# Patient Record
Sex: Female | Born: 1950 | Race: White | Hispanic: No | Marital: Married | Smoking: Never smoker
Health system: Southern US, Community
[De-identification: ages and names within clinical notes are randomized; demographics above are authoritative.]

## PROBLEM LIST (undated history)

## (undated) DIAGNOSIS — J449 Chronic obstructive pulmonary disease, unspecified: Secondary | ICD-10-CM

## (undated) DIAGNOSIS — J45909 Unspecified asthma, uncomplicated: Secondary | ICD-10-CM

## (undated) DIAGNOSIS — K5909 Other constipation: Secondary | ICD-10-CM

## (undated) DIAGNOSIS — N200 Calculus of kidney: Secondary | ICD-10-CM

## (undated) HISTORY — DX: Other constipation: K59.09

## (undated) HISTORY — DX: Unspecified asthma, uncomplicated: J45.909

## (undated) HISTORY — PX: OTHER SURGICAL HISTORY: SHX169

## (undated) HISTORY — PX: COLONOSCOPY: SHX174

## (undated) HISTORY — DX: Chronic obstructive pulmonary disease, unspecified: J44.9

## (undated) HISTORY — DX: Calculus of kidney: N20.0

---

## 2007-04-18 ENCOUNTER — Ambulatory Visit (HOSPITAL_BASED_OUTPATIENT_CLINIC_OR_DEPARTMENT_OTHER): Admission: RE | Admit: 2007-04-18 | Discharge: 2007-04-18 | Payer: Self-pay | Admitting: Orthopaedic Surgery

## 2007-08-03 ENCOUNTER — Inpatient Hospital Stay (HOSPITAL_COMMUNITY): Admission: RE | Admit: 2007-08-03 | Discharge: 2007-08-05 | Payer: Self-pay | Admitting: Orthopaedic Surgery

## 2007-08-24 IMAGING — RF DG FLUORO GUIDE SPINAL/SI JT INJ*R*
1 series · 2 of 2 positions shown · non-contrast
Comparison: none

04/19/07 - DUPLICATE COPY for exam association in [facility] ? No change from original report.
CLINICAL DATA: Cervical radiculopathy.  
SPOT FLUOROSCOPIC VIEWS DURING CERVICAL SPINE INJECTION:
No radiologist was involved in the case or present.

[Series 1: run · 2 of 2 slices shown]
[im 1/2]
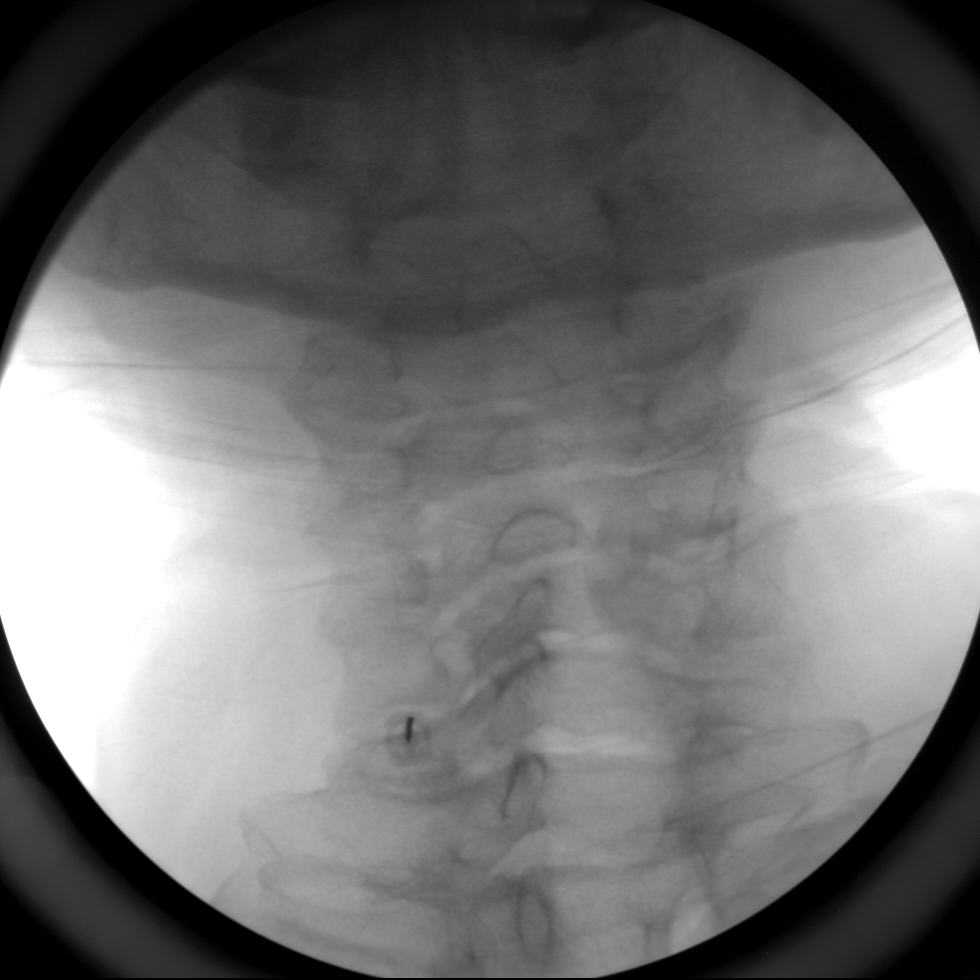
[im 2/2]
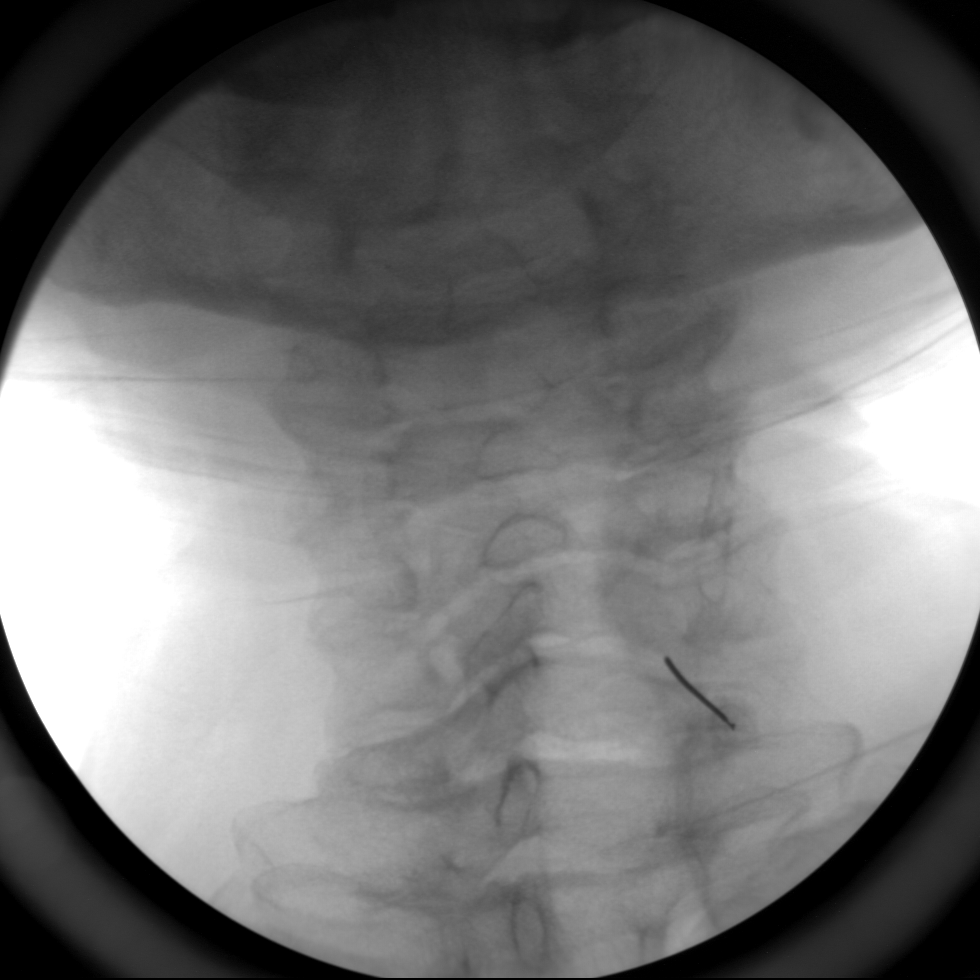

[2 of 2 positions shown; findings below may reference images not displayed]

FINDINGS: Two spot fluoroscopic views are centered over the lower cervical spine.  The C-2 level and the T1- level are not included in the imaging therefore the needle localization is difficult to assess on these two views but is localized to the lower cervical spine.
IMPRESSION: Fluoroscopic localization of the lower cervical spine.

## 2007-12-10 IMAGING — CR DG CERVICAL SPINE 2 OR 3 VIEWS
2 series · 2 of 2 positions shown · non-contrast
Comparison: none

CLINICAL DATA: Postop.  
CERVICAL SPINE ? 2-3 VIEW:

[w c-spine lat *]
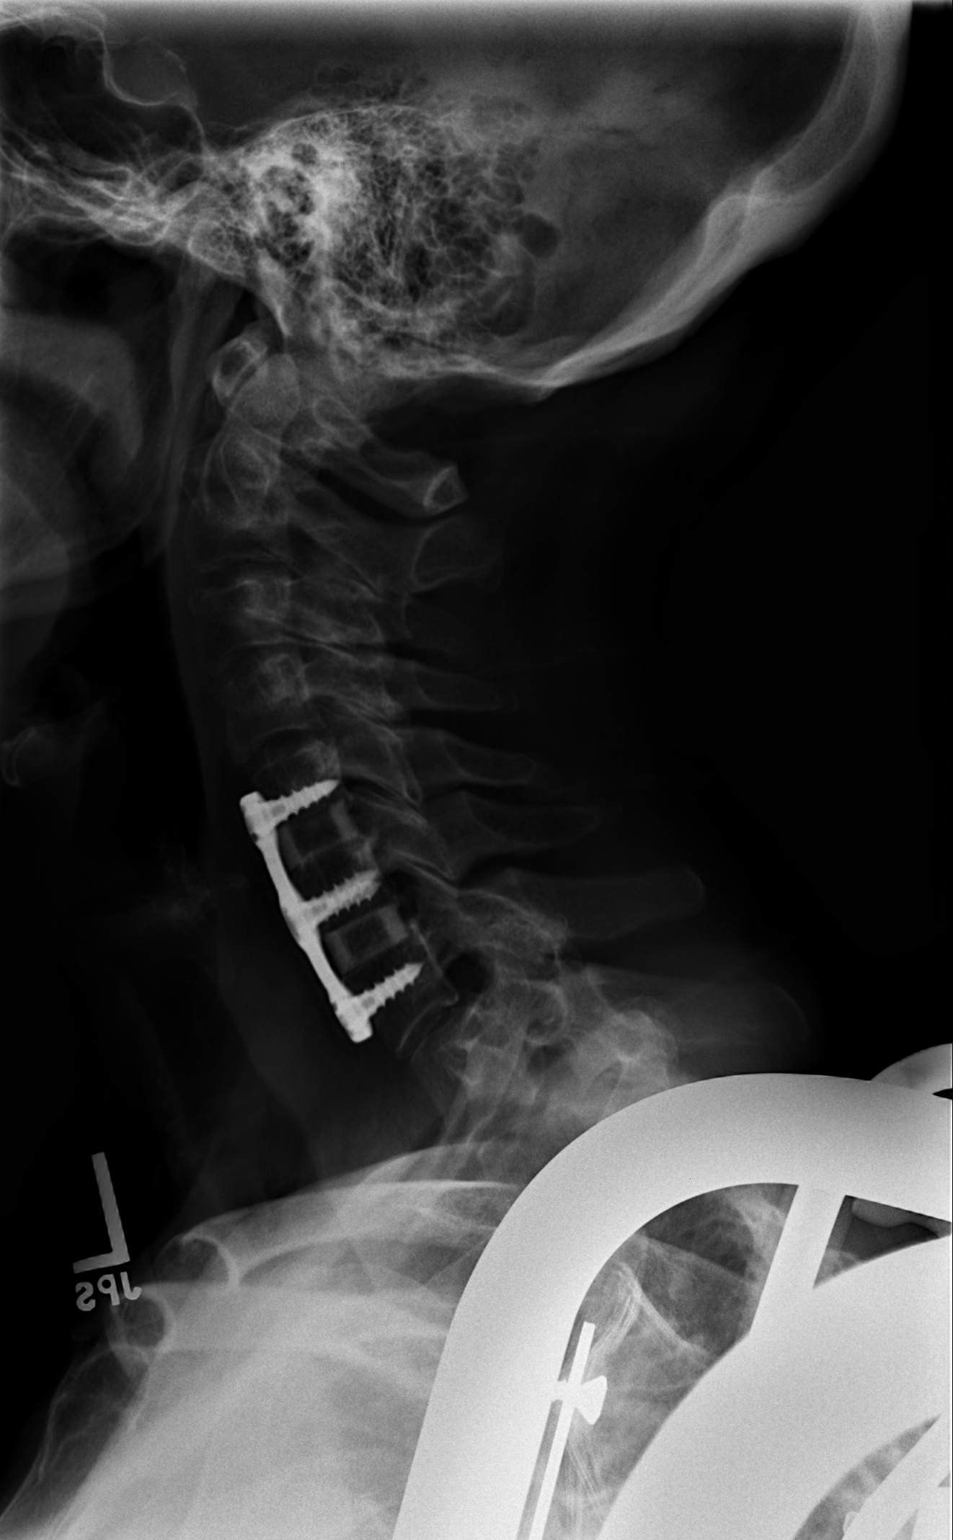

[w c-spine a.p.]
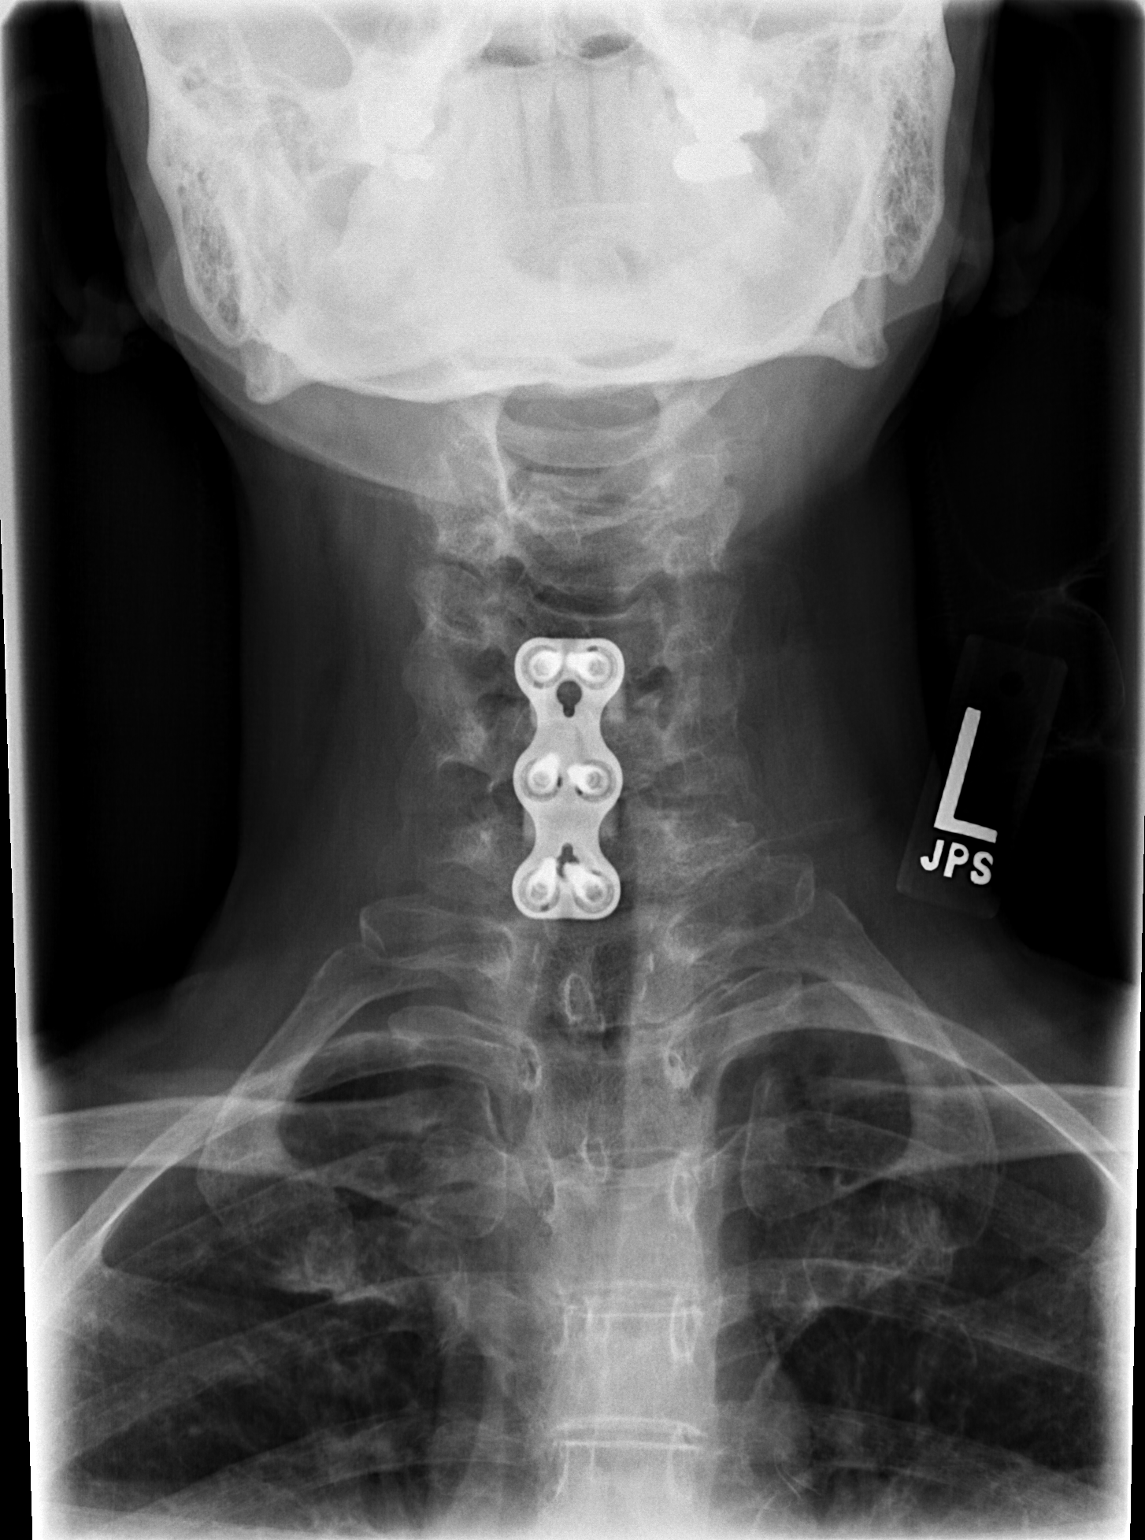

[2 of 2 positions shown; findings below may reference images not displayed]

FINDINGS: Status post fusion C5-6 and C6-7 with interbody bony plug, anterior plate and screws in place appearing in satisfactory position.  Prevertebral soft tissue prominence with slight impression upon the posterior air column.  This may be in the expected range immediately postoperative.  This could be further evaluated on follow-up if there were any symptoms referable to abnormality at this level. Result called to Dr. Ronlor
IMPRESSION: Status post fusion C5-6 and C6-7.  Please see above discussion.  .

## 2011-05-11 NOTE — Op Note (Signed)
NAMEADRI, SCHLOSS             ACCOUNT NO.:  [id number]   MEDICAL RECORD NO.:  [id number]          PATIENT TYPE:  INP   LOCATION:  5035                         FACILITY:  [facility]   PHYSICIAN:  Sharolyn Douglas, M.D.        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   PROCEDURE:  1. Anterior cervical discectomy C5-C6 and C6-C7 with decompression of      the thecal sac and nerve roots bilaterally.  2. Anterior cervical arthrodesis C5-C6 and C6-C7 with placement of two      6 mm allograft prosthesis spacers packed with local autogenous bone      graft.  3. Anterior cervical plating C5 to C7 using the Abbott spine system.   SURGEON:  Sharolyn Douglas, M.D.   ASSISTANT:  Colleen Mahar, P.A.-C.   ANESTHESIA:  General endotracheal.   ESTIMATED BLOOD LOSS:  Minimal.   COUNTS:  Needle and sponge counts correct.   INDICATIONS:  The patient is a pleasant 60 year old female with chronic  progressive neck and bilateral upper extremity pain, left greater than  right.  Her imaging studies show severe cervical spondylosis, most  advanced at C5-C6 and C6-C7 with foraminal narrowing.  She has failed  other conservative treatment modalities and now presents for ACDF at  these levels in hopes of improving her symptoms.  The risks, benefits,  and alternatives were reviewed.  The patient elected to proceed.   DESCRIPTION OF PROCEDURE:  After informed consent, she was taken to the  operating room.  She underwent general endotracheal anesthesia without  difficulty and given prophylactic IV antibiotics.  She was carefully  positioned supine with the [place] head rest, her neck in neutral  position.  The neck was prepped and draped in the usual sterile fashion.  A small transverse incision was made on the left side of the neck at the  level of the  cricoid cartilage.  Dissection was carried sharply through  the platysma.  The interval between the SCM and strap muscles medially  was developed down to the prevertebral space.  The C5-C6 level was  easily identifiable due to large anterior osteophyte.  A spinal needle  was placed at the C6-C7 disc space and intraoperative x-ray was taken to  confirm this level.  We then elevated the longus coli muscle out over  the C5-C6 and C6-C7 levels.  We placed a deep Shadowline retractor.  There was some bleeding noted coming from underneath the lateral  retractor blade.  This was evaluated and there appeared to be a small  pin point hole in the branch of the jugular vein.  This was simply  repaired with a single 4-0 silk suture.   The retractor blade was then replaced and the operation was continued.  The microscope was draped and brought into the field.  The large  anterior osteophyte was removed with a Leksell rongeur.  Caspar  distraction pins were placed at the C5,  C6, and C7 vertebral bodies and  gentle distraction was applied.  Starting at C5-C6, a radical discectomy  was completed. The disc space was collapsed and degenerative.  A high  speed bur was used to take down the uncovertebral joints and posterior  vertebral margin.  A 2 mm Kerrison punch was used to complete the  foraminotomies.  The foramen were evaluated with a blunt probe and we  felt they were well decompressed.  We prepared the cartilaginous  endplates for the arthrodesis.  Bone had been saved from the high speed  bur which was packed into a 6 mm allograft prosthesis spacer.  This was  inserted into the interspace and tamped posteriorly 1 mm.   We then performed a similar procedure at C6-C7. Again, the disc was  degenerative.  The cartilaginous endplates were scraped clean.  A high  speed bur was used to take down the posterior vertebral margins and  uncovertebral joints.  The foraminotomies were completed and the foramen   were checked with the blunt probe.  At this level, we also used a 6 mm  spacer packed with the local autogenous bone graft.  We then bent and  placed a 40 mm anterior cervical plate from C5 to C7 with six 12 mm  screws.  We ensured that the locking mechanism engaged.  The screw  purchase was adequate.  Intraoperative x-ray was taken which showed  acceptable positioning of the instrumentation from C5 to C7.   The retractor was removed.  Meticulous hemostasis was achieved.  There  was no bleeding noted.  The esophagus, trachea, and carotid sheath were  in good condition.  There did not appear to be any injury.  A deep TLSO  drain was placed.  The platysma layer was closed with interrupted 2-0  Vicryl suture.  The subcutaneous layer was closed with interrupted 3-0  Vicryl followed by a running 4-0 subcuticular Vicryl suture on the skin  edges.  Dermabond was applied.  A cervical collar was placed.  The  patient was extubated without difficulty and transferred to recovery in  stable condition.   It should be noted my assistant, Verlin Fester, P.A-C., was present  throughout procedure including the positioning and exposure. She worked  under [employer] with me providing suction and retraction.  She  assisted with the arthrodesis and instrumentation.  She then assisted  with wound closure.      Sharolyn Douglas, M.D.  Electronically Signed     MC/MEDQ  D:  08/03/2007  T:  08/03/2007  Job:  [id number]

## 2011-05-14 NOTE — Op Note (Signed)
Miranda Jones, Miranda Jones             ACCOUNT NO.:  [id number]   MEDICAL RECORD NO.:  [id number]          PATIENT TYPE:  AMB   LOCATION:  DSC                          FACILITY:  [facility]   PHYSICIAN:  Sharolyn Douglas, M.D.        DATE OF BIRTH:  May 09, 1951   DATE OF PROCEDURE:  04/18/2007  DATE OF DISCHARGE:                               OPERATIVE REPORT   DIAGNOSIS:  Cervical spondylosis and neck pain.   PROCEDURE:  1. Bilateral C5-6 facette joint injections  2. Fluoroscopic imaging used for needle placement of the above      injections.   SURGEON:  Sharolyn Douglas, M.D.   ASSISTANT:  None.   ANESTHESIA:  MAC plus local.   COMPLICATIONS:  None.   INDICATIONS:  The patient is a pleasant 60 year old female with  persistent neck pain thought to possibly be due to C5-6 facette joints.  She now presents for the above injections for further diagnostic and  potentially therapeutic purposes.  The risks, benefits, and alternatives  were reviewed.   PROCEDURE:  After informed consent, she was taken the operating room.  She was turned prone.  She underwent sedation by anesthesia.  Fluoroscopy was brought onto the field, and the C5-6 facette joints were  visualized.  22-gauge Quincke spinal needles were advanced into the  facette joints bilaterally.  Aspiration showed no blood.  Injection of  40 mg Depo-Medrol and 1 mL of preservative-free 1% lidocaine into each  facette joint bilaterally.  The patient tolerated the procedure well.  There were no apparent complications.  She was transferred to recovery  in stable condition.  I reviewed post-injection instructions with her.  She will follow up in 2-3 weeks.      Sharolyn Douglas, M.D.  Electronically Signed     MC/MEDQ  D:  04/18/2007  T:  04/18/2007  Job:  [id number]

## 2011-10-11 LAB — URINALYSIS, ROUTINE W REFLEX MICROSCOPIC
Glucose, UA: NEGATIVE
pH: 6

## 2011-10-11 LAB — ABO/RH: ABO/RH(D): O POS

## 2011-10-11 LAB — COMPREHENSIVE METABOLIC PANEL
ALT: 13
AST: 19
Alkaline Phosphatase: 71
CO2: 25
Calcium: 9.5
GFR calc non Af Amer: >60
Potassium: 3.6
Sodium: 140

## 2011-10-11 LAB — URINE CULTURE: Colony Count: 7000

## 2011-10-11 LAB — TYPE AND SCREEN
ABO/RH(D): O POS
Antibody Screen: NEGATIVE

## 2011-10-11 LAB — DIFFERENTIAL
Basophils Absolute: 0
Eosinophils Absolute: 0
Eosinophils Relative: 1
Lymphocytes Relative: 41
Lymphs Abs: 2.9
Monocytes Absolute: 0.3

## 2011-10-11 LAB — CBC
MCV: 94.7
Platelets: 374
RBC: 3.97
WBC: 7

## 2022-06-24 ENCOUNTER — Ambulatory Visit (INDEPENDENT_AMBULATORY_CARE_PROVIDER_SITE_OTHER): Payer: Medicare Other | Admitting: Internal Medicine

## 2022-06-24 ENCOUNTER — Encounter: Payer: Self-pay | Admitting: Internal Medicine

## 2022-06-24 VITALS — BP 124/75 | HR 76 | Temp 97.4°F | Ht 62.0 in | Wt 113.6 lb

## 2022-06-24 DIAGNOSIS — K219 Gastro-esophageal reflux disease without esophagitis: Secondary | ICD-10-CM | POA: Diagnosis not present

## 2022-06-24 DIAGNOSIS — R197 Diarrhea, unspecified: Secondary | ICD-10-CM | POA: Diagnosis not present

## 2022-06-24 DIAGNOSIS — R159 Full incontinence of feces: Secondary | ICD-10-CM

## 2022-06-24 DIAGNOSIS — K582 Mixed irritable bowel syndrome: Secondary | ICD-10-CM | POA: Diagnosis not present

## 2022-06-24 NOTE — Progress Notes (Addendum)
Primary Care Physician:  Margarita Sermons, MD Primary Gastroenterologist:  Dr. Marletta Lor  Chief Complaint  Patient presents with   New Patient (Initial Visit)    IBS- with constipation and diarrhea, mostly diarrhea stated the pt    HPI:   Miranda Jones is a 71 y.o. female who presents to clinic today by referral from her PCP Dr. Josephine Igo for evaluation.  History of irritable bowel syndrome.  Previously followed in [place], see further work-up below.  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  She states this caused diarrhea so she was decreased to 72 mcg.  She no longer takes this medication as she states it just make diarrhea worse.  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  Also history of peptic ulcer disease in the setting of chronic NSAID use with meloxicam.  Has been on omeprazole since September 2022.  No longer taking NSAIDs.  Denies any heartburn, reflux, dysphagia/odynophagia, epigastric or chest pain.  She is wondering if she can stop this medication.  Regards to her bowels, she states currently she is having constant diarrhea 4-5 times daily always loose, watery.  She states she is scared to leave the house or to eat as she will immediately have to have diarrhea.  Notes a few episodes of incontinence, now wearing briefs.  States her life is very much affected by this and this is her last chance.  Notes feeling depressed about her condition.  EGD 08/2021- three 1-2 cm DUs. Hpylori bx negative. CT with gastric wall thickening  Colonoscopy 05/2020-[facility]-stecoral ulcer with 3 small adenomas and multiple benign polyps Colonoscopy 05/2014-[facility]- multiple adenomas with a "3-5-year" recall.  Past Medical History:  Diagnosis Date   Asthma    Chronic constipation    COPD (chronic obstructive pulmonary disease) (HCC)     Kidney stones     Past Surgical History:  Procedure Laterality Date   CESAREAN SECTION     COLONOSCOPY     hysterectomy         Allergies as of 06/24/2022 - never reviewed  Allergen Reaction Noted   Dilaudid [hydromorphone]  06/24/2022   Macrobid [nitrofurantoin]  06/24/2022    Family History  Problem Relation Age of Onset   Colon cancer Mother    Heart disease Father     Social History   Socioeconomic History   Marital status: Married    Spouse name: Not on file   Number of children: Not on file   Years of education: Not on file   Highest education level: Not on file  Occupational History   Not on file  Tobacco Use   Smoking status: Never   Smokeless tobacco: Never  Substance and Sexual Activity   Alcohol use: Never   Drug use: Not on file   Sexual activity: Not on file  Other Topics Concern   Not on file  Social History Narrative   Not on file   Social Determinants of Health   Financial Resource Strain: Not on file  Food Insecurity: Not on file  Transportation Needs: Not on file  Physical Activity: Not on file  Stress: Not on file  Social Connections: Not on file  Intimate Partner Violence: Not on file    Subjective: Review of Systems  Constitutional:  Negative for chills and fever.  HENT:  Negative for congestion and hearing loss.   Eyes:  Negative  for blurred vision and double vision.  Respiratory:  Negative for cough and shortness of breath.   Cardiovascular:  Negative for chest pain and palpitations.  Gastrointestinal:  Positive for constipation and heartburn. Negative for abdominal pain, blood in stool, diarrhea, melena and vomiting.       Incontinence   Genitourinary:  Negative for dysuria and urgency.  Musculoskeletal:  Negative for joint pain and myalgias.  Skin:  Negative for itching and rash.  Neurological:  Negative for dizziness and headaches.  Psychiatric/Behavioral:  Negative for depression. The patient is not nervous/anxious.         Objective: BP 124/75   Pulse 76   Temp (!) 97.4 F (36.3 C)   Ht 5\' 2"  (1.575 m)   Wt 113 lb 9.6 oz (51.5 kg)   BMI 20.78 kg/m  Physical Exam Constitutional:      Appearance: Normal appearance.  HENT:     Head: Normocephalic and atraumatic.  Eyes:     Extraocular Movements: Extraocular movements intact.     Conjunctiva/sclera: Conjunctivae normal.  Cardiovascular:     Rate and Rhythm: Normal rate and regular rhythm.  Pulmonary:     Effort: Pulmonary effort is normal.     Breath sounds: Normal breath sounds.  Abdominal:     General: Bowel sounds are normal.     Palpations: Abdomen is soft.  Musculoskeletal:        General: No swelling. Normal range of motion.     Cervical back: Normal range of motion and neck supple.  Skin:    General: Skin is warm and dry.     Coloration: Skin is not jaundiced.  Neurological:     General: No focal deficit present.     Mental Status: She is alert and oriented to person, place, and time.  Psychiatric:        Mood and Affect: Mood normal.        Behavior: Behavior normal.      Assessment: *Irritable bowel syndrome-mixed with constipation and diarrhea *Fecal incontinence  *Chronic GERD  Plan: Etiology of patient's symptoms likely IBS.  Recent x-ray showing moderate stool burden.  Likely constipation with overflow diarrhea though patient states she feels like she is just having diarrhea 4-5 times daily without constipation.  Has been on dicyclomine for 1 to 2 days.  We will continue for now.  Recommend she start taking over-the-counter Benefiber daily.  Counseled on drinking at least 6 glasses of water daily.  Can consider trial of Trulance pending clinical course.  Does not wish to go back on Linzess.  Low FODMAP diet  Will check celiac panel.   Unclear how much benefit she is getting from omeprazole.  We will stop.  Been on this medication since 08/2021.  History of ulcers in the past while taking meloxicam now  little longer taking NSAIDs.  Follow-up in 6 to 8 weeks.  06/27/2022 2:25 PM   Disclaimer: This note was dictated with voice recognition software. Similar sounding words can inadvertently be transcribed and may not be corrected upon review.

## 2022-06-24 NOTE — Patient Instructions (Signed)
For your irritable bowel syndrome, continue on dicyclomine.  You can take this medication up to 4 times a day.  I would recommend you start taking over-the-counter Benefiber daily.  Okay to stop your omeprazole, I wonder how much benefit you are actually getting from this medication.  As you are no longer on chronic NSAIDs with meloxicam, your ulcers should have healed by now.  We may need to consider colonoscopy to further evaluate your symptoms to rule out a condition called microscopic colitis.  PPI's such as omeprazole have also been linked to this condition.  I will order blood work at [facility] to rule out celiac disease which is gluten sensitivity.  Recommend low FODMAP diet.  Follow-up in 6 weeks.  It was very nice meeting both you today.  Dr. Marletta Lor

## 2022-06-27 LAB — CELIAC AB TTG DGP TIGA
Antigliadin Abs, IgA: 4 units (ref 0–19)
Gliadin IgG: 2 units (ref 0–19)
IgA/Immunoglobulin A, Serum: 194 mg/dL (ref 87–352)
Tissue Transglut Ab: 2 U/mL (ref 0–5)
Transglutaminase IgA: <2 U/mL (ref 0–3)

## 2022-07-06 ENCOUNTER — Encounter: Payer: Self-pay | Admitting: Internal Medicine

## 2022-08-04 ENCOUNTER — Encounter: Payer: Self-pay | Admitting: Internal Medicine

## 2022-08-04 ENCOUNTER — Ambulatory Visit (INDEPENDENT_AMBULATORY_CARE_PROVIDER_SITE_OTHER): Payer: Medicare Other | Admitting: Internal Medicine

## 2022-08-04 DIAGNOSIS — K589 Irritable bowel syndrome without diarrhea: Secondary | ICD-10-CM | POA: Insufficient documentation

## 2022-08-04 DIAGNOSIS — K582 Mixed irritable bowel syndrome: Secondary | ICD-10-CM

## 2022-08-04 DIAGNOSIS — K219 Gastro-esophageal reflux disease without esophagitis: Secondary | ICD-10-CM | POA: Diagnosis not present

## 2022-08-04 MED ORDER — DICYCLOMINE HCL 10 MG PO CAPS: 10.0000 mg | ORAL_CAPSULE | Freq: Three times a day (TID) | ORAL | 11 refills | Status: DC

## 2022-08-04 NOTE — Patient Instructions (Signed)
I am happy to hear that you are doing much better.  Continue on dicyclomine.  I have sent in refills today.  Continue on Benefiber.  You can take the omeprazole as needed for breakthrough symptoms of heartburn.  Otherwise follow-up in 4 months.  It was very nice seeing both of you again today.  Dr. Marletta Lor

## 2022-08-04 NOTE — Progress Notes (Signed)
Primary Care Physician:  Margarita Sermons, MD Primary Gastroenterologist:  Dr. Marletta Lor  Chief Complaint  Patient presents with   Irritable Bowel Syndrome    Follow up on IBS. Was started on bentyl. Taking one bid and reports it is working well.     HPI:   Miranda Jones is a 71 y.o. female who presents to clinic today for follow-up visit.  History of irritable bowel syndrome.  Previously followed in [place], see further work-up below.  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  She states this caused diarrhea so she was decreased to 72 mcg.  She no longer takes this medication as she states it just make diarrhea worse.  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  Also history of peptic ulcer disease in the setting of chronic NSAID use with meloxicam.  No longer taking NSAIDs. Previously on omeprazole for over 9 months.  I stopped this on previous visit she states her symptoms are well-controlled. Denies any heartburn, reflux, dysphagia/odynophagia, epigastric or chest pain.    Regards to her bowels, on previous visit she noted to me that she was having constant diarrhea 4-5 times daily always loose, watery.  She states she is scared to leave the house or to eat as she will immediately have to have diarrhea.  Notes a few episodes of incontinence, now wearing briefs.  States her life is very much affected by this and this is her last chance.  Notes feeling depressed about her condition.  Celiac panel negative.  Was started on Bentyl June 2023.  Also instructed her to follow a low FODMAP diet as well as start on Benefiber.  Today, she states her symptoms are vastly improved.  Will have a normal bowel movement every 1 to 2 days.  No melena hematochezia.  No further diarrhea or incontinence.   Procedures: EGD 08/2021- three 1-2 cm  DUs. Hpylori bx negative. CT with gastric wall thickening  Colonoscopy 05/2020-[facility]-stecoral ulcer with 3 small adenomas and multiple benign polyps Colonoscopy 05/2014-[facility]- multiple adenomas with a "3-5-year" recall.  Past Medical History:  Diagnosis Date   Asthma    Chronic constipation    COPD (chronic obstructive pulmonary disease) (HCC)    Kidney stones     Past Surgical History:  Procedure Laterality Date   CESAREAN SECTION     COLONOSCOPY     hysterectomy         Allergies as of 08/04/2022 - Review Complete 08/04/2022  Allergen Reaction Noted   Dilaudid [hydromorphone]  06/24/2022   Macrobid [nitrofurantoin]  06/24/2022    Family History  Problem Relation Age of Onset   Colon cancer Mother    Heart disease Father     Social History   Socioeconomic History   Marital status: Married    Spouse name: Not on file   Number of children: Not on file   Years of education: Not on file   Highest education level: Not on file  Occupational History   Not on file  Tobacco Use   Smoking status: Never    Passive exposure: Never   Smokeless tobacco: Never  Substance and Sexual Activity   Alcohol use: Never   Drug use: Not on file   Sexual activity: Not on file  Other Topics Concern   Not on file  Social History Narrative   Not on file   Social  Determinants of Health   Financial Resource Strain: Not on file  Food Insecurity: Not on file  Transportation Needs: Not on file  Physical Activity: Not on file  Stress: Not on file  Social Connections: Not on file  Intimate Partner Violence: Not on file    Subjective: Review of Systems  Constitutional:  Negative for chills and fever.  HENT:  Negative for congestion and hearing loss.   Eyes:  Negative for blurred vision and double vision.  Respiratory:  Negative for cough and shortness of breath.   Cardiovascular:  Negative for chest pain and palpitations.  Gastrointestinal:  Positive for constipation and  heartburn. Negative for abdominal pain, blood in stool, diarrhea, melena and vomiting.       Incontinence   Genitourinary:  Negative for dysuria and urgency.  Musculoskeletal:  Negative for joint pain and myalgias.  Skin:  Negative for itching and rash.  Neurological:  Negative for dizziness and headaches.  Psychiatric/Behavioral:  Negative for depression. The patient is not nervous/anxious.        Objective: BP 137/71 (BP Location: Left Arm, Patient Position: Sitting, Cuff Size: Normal)   Pulse 74   Temp 97.7 F (36.5 C) (Oral)   Ht 5\' 2"  (1.575 m)   Wt 114 lb 3.2 oz (51.8 kg)   BMI 20.89 kg/m  Physical Exam Constitutional:      Appearance: Normal appearance.  HENT:     Head: Normocephalic and atraumatic.  Eyes:     Extraocular Movements: Extraocular movements intact.     Conjunctiva/sclera: Conjunctivae normal.  Cardiovascular:     Rate and Rhythm: Normal rate and regular rhythm.  Pulmonary:     Effort: Pulmonary effort is normal.     Breath sounds: Normal breath sounds.  Abdominal:     General: Bowel sounds are normal.     Palpations: Abdomen is soft.  Musculoskeletal:        General: No swelling. Normal range of motion.     Cervical back: Normal range of motion and neck supple.  Skin:    General: Skin is warm and dry.     Coloration: Skin is not jaundiced.  Neurological:     General: No focal deficit present.     Mental Status: She is alert and oriented to person, place, and time.  Psychiatric:        Mood and Affect: Mood normal.        Behavior: Behavior normal.      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  Recent x-ray showing moderate stool burden though on previous visit, patient was just having straight diarrhea and incontinence.  Symptoms vastly improved on dicyclomine.  We will continue.  Continue  Benefiber.  Continue to follow low FODMAP diet.  Counseled on drinking at least 6 glasses of water daily.  Patient can take omeprazole strictly on an as-needed basis.  Follow-up in 4 months    08/04/2022 11:33 AM   Disclaimer: This note was dictated with voice recognition software. Similar sounding words can inadvertently be transcribed and may not be corrected upon review.

## 2022-08-11 ENCOUNTER — Ambulatory Visit: Payer: Medicare Other | Admitting: Internal Medicine

## 2022-10-26 ENCOUNTER — Encounter: Payer: Self-pay | Admitting: *Deleted

## 2022-12-30 ENCOUNTER — Ambulatory Visit: Payer: Medicare Other | Admitting: Internal Medicine

## 2023-01-13 ENCOUNTER — Ambulatory Visit: Payer: Medicare Other | Admitting: Internal Medicine

## 2023-02-09 ENCOUNTER — Ambulatory Visit: Payer: Medicare Other | Admitting: Internal Medicine

## 2023-02-10 ENCOUNTER — Ambulatory Visit: Payer: Medicare Other | Admitting: Gastroenterology

## 2023-02-23 ENCOUNTER — Ambulatory Visit (INDEPENDENT_AMBULATORY_CARE_PROVIDER_SITE_OTHER): Payer: Medicare Other | Admitting: Internal Medicine

## 2023-02-23 ENCOUNTER — Encounter: Payer: Self-pay | Admitting: Internal Medicine

## 2023-02-23 VITALS — BP 142/79 | HR 91 | Temp 97.8°F | Ht 62.0 in | Wt 114.9 lb

## 2023-02-23 DIAGNOSIS — K219 Gastro-esophageal reflux disease without esophagitis: Secondary | ICD-10-CM

## 2023-02-23 DIAGNOSIS — K582 Mixed irritable bowel syndrome: Secondary | ICD-10-CM | POA: Diagnosis not present

## 2023-02-23 NOTE — Patient Instructions (Signed)
  I am happy to hear that you are doing much better.  Continue on dicyclomine.  You should have enough refills until August   Continue on [place].  You can take the omeprazole as needed for breakthrough symptoms of heartburn.  Otherwise follow-up in 6 months. We will do this visit virtually.   It was very nice seeing both of you again today.  Dr. Abbey Chatters

## 2023-02-23 NOTE — Progress Notes (Signed)
Primary Care Physician:  Liliane Shi, MD Primary Gastroenterologist:  Dr. Abbey Chatters  Chief Complaint  Patient presents with   Follow-up    Patient here today for a follow up appointment on Gerd.    HPI:   Miranda Jones is a 72 y.o. female who presents to clinic today for follow-up visit.  History of irritable bowel syndrome.  Previously followed in [place], see further work-up below.  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  She states this caused diarrhea so she was decreased to 72 mcg.  She no longer takes this medication as she states it just make diarrhea worse.  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  Also history of peptic ulcer disease in the setting of chronic NSAID use with meloxicam.  No longer taking NSAIDs. Previously on omeprazole for over 9 months.  I stopped this on previous visit she states her symptoms are well-controlled. Denies any heartburn, reflux, dysphagia/odynophagia, epigastric or chest pain.    Regards to her bowels, on previous visit she noted to me that she was having constant diarrhea 4-5 times daily always loose, watery.  She states she is scared to leave the house or to eat as she will immediately have to have diarrhea.  Notes a few episodes of incontinence, now wearing briefs.  States her life is very much affected by this and this is her last chance.  Notes feeling depressed about her condition.  Celiac panel negative.  Was started on Bentyl June 2023.  Also instructed her to follow a low FODMAP diet as well as start on Benefiber.  Today, she states her symptoms are vastly improved.  Will have a normal bowel movement every 1 to 2 days.  No melena hematochezia.  No further diarrhea or incontinence.   Procedures: EGD 08/2021- three 1-2 cm DUs. Hpylori bx negative. CT with gastric wall  thickening  Colonoscopy 05/2020-[facility]-stecoral ulcer with 3 small adenomas and multiple benign polyps Colonoscopy 05/2014-[facility]- multiple adenomas with a "3-5-year" recall.  Past Medical History:  Diagnosis Date   Asthma    Chronic constipation    COPD (chronic obstructive pulmonary disease) ([place])    Kidney stones     Past Surgical History:  Procedure Laterality Date   CESAREAN SECTION     COLONOSCOPY     hysterectomy         Allergies as of 02/23/2023 - Review Complete 08/04/2022  Allergen Reaction Noted   Dilaudid [hydromorphone]  06/24/2022   Macrobid [nitrofurantoin]  06/24/2022    Family History  Problem Relation Age of Onset   Colon cancer Mother    Heart disease Father     Social History   Socioeconomic History   Marital status: Married    Spouse name: Not on file   Number of children: Not on file   Years of education: Not on file   Highest education level: Not on file  Occupational History   Not on file  Tobacco Use   Smoking status: Never    Passive exposure: Never   Smokeless tobacco: Never  Substance and Sexual Activity   Alcohol use: Never   Drug use: Not on file   Sexual activity: Not on file  Other Topics Concern   Not on file  Social History Narrative   Not on file   Social Determinants of Health   Financial Resource Strain: Not on  file  Food Insecurity: Not on file  Transportation Needs: Not on file  Physical Activity: Not on file  Stress: Not on file  Social Connections: Not on file  Intimate Partner Violence: Not on file    Subjective: Review of Systems  Constitutional:  Negative for chills and fever.  HENT:  Negative for congestion and hearing loss.   Eyes:  Negative for blurred vision and double vision.  Respiratory:  Negative for cough and shortness of breath.   Cardiovascular:  Negative for chest pain and palpitations.  Gastrointestinal:  Positive for constipation and heartburn. Negative for abdominal pain, blood in  stool, diarrhea, melena and vomiting.       Incontinence   Genitourinary:  Negative for dysuria and urgency.  Musculoskeletal:  Negative for joint pain and myalgias.  Skin:  Negative for itching and rash.  Neurological:  Negative for dizziness and headaches.  Psychiatric/Behavioral:  Negative for depression. The patient is not nervous/anxious.        Objective: There were no vitals taken for this visit. Physical Exam Constitutional:      Appearance: Normal appearance.  HENT:     Head: Normocephalic and atraumatic.  Eyes:     Extraocular Movements: Extraocular movements intact.     Conjunctiva/sclera: Conjunctivae normal.  Cardiovascular:     Rate and Rhythm: Normal rate and regular rhythm.  Pulmonary:     Effort: Pulmonary effort is normal.     Breath sounds: Normal breath sounds.  Abdominal:     General: Bowel sounds are normal.     Palpations: Abdomen is soft.  Musculoskeletal:        General: No swelling. Normal range of motion.     Cervical back: Normal range of motion and neck supple.  Skin:    General: Skin is warm and dry.     Coloration: Skin is not jaundiced.  Neurological:     General: No focal deficit present.     Mental Status: She is alert and oriented to person, place, and time.  Psychiatric:        Mood and Affect: Mood normal.        Behavior: Behavior normal.      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  Recent x-ray showing moderate stool burden though on previous visit, patient was just having straight diarrhea and incontinence.  Symptoms vastly improved on dicyclomine.  We will continue.  Continue Benefiber.  Continue to follow low FODMAP diet.  Counseled on drinking at least 6 glasses of water daily.  Patient can take omeprazole strictly on an as-needed basis.  Follow-up in 6 months    02/23/2023  11:13 AM   Disclaimer: This note was dictated with voice recognition software. Similar sounding words can inadvertently be transcribed and may not be corrected upon review.

## 2023-08-24 ENCOUNTER — Encounter: Payer: Self-pay | Admitting: Internal Medicine

## 2023-08-24 ENCOUNTER — Telehealth (INDEPENDENT_AMBULATORY_CARE_PROVIDER_SITE_OTHER): Payer: Medicare Other | Admitting: Internal Medicine

## 2023-08-24 VITALS — Ht 62.0 in | Wt 115.0 lb

## 2023-08-24 DIAGNOSIS — R197 Diarrhea, unspecified: Secondary | ICD-10-CM | POA: Diagnosis not present

## 2023-08-24 DIAGNOSIS — K219 Gastro-esophageal reflux disease without esophagitis: Secondary | ICD-10-CM | POA: Diagnosis not present

## 2023-08-24 DIAGNOSIS — K582 Mixed irritable bowel syndrome: Secondary | ICD-10-CM

## 2023-08-24 MED ORDER — DICYCLOMINE HCL 10 MG PO CAPS: 10.0000 mg | ORAL_CAPSULE | Freq: Three times a day (TID) | ORAL | 11 refills | Status: DC

## 2023-08-24 NOTE — Patient Instructions (Signed)
I am happy to hear that you are doing well.  Continue on dicyclomine. I refilled this today. We do have room to increase dose if needed.   Continue on Benefiber.  You can take the omeprazole as needed for breakthrough symptoms of heartburn.  We will plan on colonoscopy 2026.  Otherwise follow-up in 6 months. We will do this visit virtually.   It was very nice seeing you again today.  Dr. Marletta Lor

## 2023-08-24 NOTE — Progress Notes (Signed)
Primary Care Physician:  Margarita Sermons, MD    Patient Location: Home   Provider Location: [facility] office   Reason for Visit: Follow up    Total time (minutes) spent on medical discussion: >21 minutes     Virtual Visit via MyChart Video Note  I connected with Cory Munch on 08/24/23 at 11:30 AM EDT by video and verified that I am speaking with the correct person using two identifiers.   I discussed the limitations, risks, security and privacy concerns of performing an evaluation and management service by video and the availability of in person appointments. I also discussed with the patient that there may be a patient responsible charge related to this service. The patient expressed understanding and agreed to proceed.  Chief Complaint  Patient presents with   Follow-up    Patient being seen today due to a follow up on Gerd. Patient says when she has issues with heart burn after taking her medications. She says when this happens she takes omeprazole 40 mg prn.      History of Present Illness: Miranda Jones is a 72 y.o. female who presents to clinic today for follow-up visit.  History of irritable bowel syndrome.  Previously followed in [place], see further work-up below.   History of peptic ulcer disease in the setting of chronic NSAID use with meloxicam.  No longer taking NSAIDs. Currently taking Omeprazole 40 mg as needed.   Regards to her bowels, on previous visit she noted to me that she was having constant diarrhea 4-5 times daily always loose, watery.  She states she is scared to leave the house or to eat as she will immediately have to have diarrhea.  Notes a few episodes of incontinence, now wearing briefs.  States her life is very much affected by this and this is her last chance.  Notes feeling depressed about her condition.   Celiac panel negative.  Was started on Bentyl June 2023 and symptoms vastly improved.  Also instructed her to follow a low  FODMAP diet as well as start on Benefiber.    Today, will have a normal bowel movement every 1 to 2 days.  No melena hematochezia.  No further diarrhea or incontinence.    Procedures: EGD 08/2021- three 1-2 cm DUs. Hpylori bx negative. CT with gastric wall thickening  Colonoscopy 05/2020-[facility]-stecoral ulcer with 3 small adenomas and multiple benign polyps Colonoscopy 05/2014-[facility]- multiple adenomas with a "3-5-year" recall.  Past Medical History:  Diagnosis Date   Asthma    Chronic constipation    COPD (chronic obstructive pulmonary disease) ([facility])    Kidney stones      Past Surgical History:  Procedure Laterality Date   CESAREAN SECTION     COLONOSCOPY     hysterectomy       Current Meds  Medication Sig   Ascorbic Acid (VITAMIN C) 500 MG CAPS Take by mouth. One daily   BIOTIN PO Take by mouth. 1,000 mg daily   Calcium Carbonate-Vit D-Min (CALTRATE 600+D PLUS PO) Take by mouth daily at 6 (six) AM.   cetirizine (ZYRTEC) 10 MG tablet Take 10 mg by mouth daily.   clonazePAM (KLONOPIN) 1 MG tablet Take 1 mg by mouth at bedtime.   dicyclomine (BENTYL) 10 MG capsule Take 1 capsule (10 mg total) by mouth 4 (four) times daily -  before meals and at bedtime. One bid   minoxidil (LONITEN) 2.5 MG tablet Take by mouth daily. 1/2 pill daily   OnabotulinumtoxinA (  BOTOX IJ) Inject as directed. Q three months due to Migraine   OVER THE COUNTER MEDICATION Vit D once per day  B Complex once per day.   pramipexole (MIRAPEX) 0.25 MG tablet Take 0.25 mg by mouth. One at night   promethazine (PHENERGAN) 25 MG tablet Take 25 mg by mouth every 6 (six) hours as needed for nausea or vomiting.   topiramate (TOPAMAX) 50 MG tablet Take 50 mg by mouth daily. One at night   Turmeric (QC TUMERIC COMPLEX PO) Take by mouth. Tumeric Curcumin Complex 500mg  daily   Wheat Dextrin (BENEFIBER PO) Take by mouth. Benefiber prebiotic fiber supplement daily   ZOLMitriptan (ZOMIG) 2.5 MG tablet Take 2.5 mg by  mouth once. May repeat in 2 hours if headache persists or recurs.     Family History  Problem Relation Age of Onset   Colon cancer Mother    Heart disease Father     Social History   Socioeconomic History   Marital status: Married    Spouse name: Not on file   Number of children: Not on file   Years of education: Not on file   Highest education level: Not on file  Occupational History   Not on file  Tobacco Use   Smoking status: Never    Passive exposure: Never   Smokeless tobacco: Never  Vaping Use   Vaping status: Never Used  Substance and Sexual Activity   Alcohol use: Never   Drug use: Never   Sexual activity: Not on file  Other Topics Concern   Not on file  Social History Narrative   Not on file   Social Determinants of Health   Financial Resource Strain: Low Risk  (03/17/2023)   Received from [facility], [facility]   Overall Financial Resource Strain (CARDIA)    Difficulty of Paying Living Expenses: Not hard at all  Food Insecurity: No Food Insecurity (03/17/2023)   Received from [facility], [facility]   Hunger Vital Sign    Worried About Running Out of Food in the Last Year: Never true    Ran Out of Food in the Last Year: Never true  Transportation Needs: No Transportation Needs (03/17/2023)   Received from [facility], [facility]   PRAPARE - Transportation    Lack of Transportation (Medical): No    Lack of Transportation (Non-Medical): No  Physical Activity: Insufficiently Active (03/17/2023)   Received from [facility], [facility]   Exercise Vital Sign    Days of Exercise per Week: 1 day    Minutes of Exercise per Session: 10 min  Stress: No Stress Concern Present (03/17/2023)   Received from [facility], [facility] - Occupational Stress Questionnaire    Feeling of Stress : Only a little  Social Connections: Somewhat Isolated (03/17/2023)   Received from [facility], [facility]    Social Network    How would you rate your social network (family, work, friends)?: Restricted participation with some degree of social isolation       Review of Systems: Gen: Denies fever, chills, anorexia. Denies fatigue, weakness, weight loss.  CV: Denies chest pain, palpitations, syncope, peripheral edema, and claudication. Resp: Denies dyspnea at rest, cough, wheezing, coughing up blood, and pleurisy. GI: see HPI Derm: Denies rash, itching, dry skin Psych: Denies depression, anxiety, memory loss, confusion. No homicidal or suicidal ideation.  Heme: Denies bruising, bleeding, and enlarged lymph nodes.  Observations/Objective: No distress. Unable to perform  physical exam due to video encounter.    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     Symptoms vastly improved on dicyclomine.  We will continue.   Continue Benefiber.  Continue to follow low FODMAP diet.  Counseled on drinking at least 6 glasses of water daily.   Patient can take omeprazole on an as-needed basis.  Colonoscopy recall 2026  Follow-up in 6 months with virtual visit.   I discussed the assessment and treatment plan with the patient. The patient was provided an opportunity to ask questions and all were answered. The patient agreed with the plan and demonstrated an understanding of the instructions.   The patient was advised to call back or seek an in-person evaluation if the symptoms worsen or if the condition fails to improve as anticipated.  I provided >21 minutes of face-to-face time during this MyChart Video encounter.

## 2023-10-05 ENCOUNTER — Other Ambulatory Visit: Payer: Self-pay | Admitting: Internal Medicine

## 2023-11-19 ENCOUNTER — Other Ambulatory Visit: Payer: Self-pay | Admitting: Internal Medicine

## 2023-12-23 ENCOUNTER — Other Ambulatory Visit: Payer: Self-pay | Admitting: Internal Medicine

## 2024-02-16 ENCOUNTER — Telehealth: Payer: Medicare Other | Admitting: Internal Medicine

## 2024-02-16 ENCOUNTER — Encounter (INDEPENDENT_AMBULATORY_CARE_PROVIDER_SITE_OTHER): Payer: Self-pay

## 2024-02-17 ENCOUNTER — Telehealth: Payer: Self-pay

## 2024-02-17 ENCOUNTER — Telehealth: Payer: Medicare Other | Admitting: Internal Medicine

## 2024-02-17 VITALS — Ht 62.0 in | Wt 112.0 lb

## 2024-02-17 DIAGNOSIS — K219 Gastro-esophageal reflux disease without esophagitis: Secondary | ICD-10-CM

## 2024-02-17 DIAGNOSIS — K582 Mixed irritable bowel syndrome: Secondary | ICD-10-CM | POA: Diagnosis not present

## 2024-02-17 DIAGNOSIS — Z860101 Personal history of adenomatous and serrated colon polyps: Secondary | ICD-10-CM

## 2024-02-17 DIAGNOSIS — R159 Full incontinence of feces: Secondary | ICD-10-CM

## 2024-02-17 MED ORDER — OMEPRAZOLE 20 MG PO CPDR: 20.0000 mg | DELAYED_RELEASE_CAPSULE | Freq: Every day | ORAL | 3 refills | Status: AC

## 2024-02-17 NOTE — Progress Notes (Signed)
Primary Care Physician:  Margarita Sermons, MD    Patient Location: Home   Provider Location: [facility] office   Reason for Visit: Follow up visit   Total time (minutes) spent on medical discussion: >21 minutes     Virtual Visit via MyChart Video Note  I connected with Miranda Jones on 02/17/24 at 10:00 AM EST by video and verified that I am speaking with the correct person using two identifiers.   I discussed the limitations, risks, security and privacy concerns of performing an evaluation and management service by video and the availability of in person appointments. I also discussed with the patient that there may be a patient responsible charge related to this service. The patient expressed understanding and agreed to proceed.  Chief Complaint  Patient presents with   Follow-up    Follow up on IBS-D. Pt about the same. Also follow up on GERD. Only takes Omeprazole if she has a flair up     History of Present Illness: Miranda Jones is a 73 y.o. female who presents to clinic today for follow-up visit.  History of irritable bowel syndrome.  Previously followed in [place], see further work-up below.   History of peptic ulcer disease in the setting of chronic NSAID use with meloxicam.  No longer taking NSAIDs. Currently taking Omeprazole 40 mg as needed.   Regards to her bowels, on initial visit she noted to me that she was having constant diarrhea 4-5 times daily always loose, watery.  She states she is scared to leave the house or to eat as she will immediately have to have diarrhea.  Notes a few episodes of incontinence, now wearing briefs.  States her life is very much affected by this and this is her last chance.  Notes feeling depressed about her condition.   Celiac panel negative.  Was started on Bentyl June 2023 and symptoms vastly improved.  Also instructed her to follow a low FODMAP diet as well as start on Benefiber.    Today, will have a normal bowel  movement every 1 to 2 days.  No melena hematochezia.  No further diarrhea or incontinence.    Procedures: EGD 08/2021- three 1-2 cm DUs. Hpylori bx negative. CT with gastric wall thickening  Colonoscopy 05/2020-[facility]-stecoral ulcer with 3 small adenomas and multiple benign polyps Colonoscopy 05/2014-[facility]- multiple adenomas with a "3-5-year" recall.  Past Medical History:  Diagnosis Date   Asthma    Chronic constipation    COPD (chronic obstructive pulmonary disease) ([facility])    Kidney stones      Past Surgical History:  Procedure Laterality Date   CESAREAN SECTION     COLONOSCOPY     hysterectomy       Current Meds  Medication Sig   Ascorbic Acid (VITAMIN C) 500 MG CAPS Take by mouth. One daily   BIOTIN PO Take by mouth. 1,000 mg daily   Calcium Carbonate-Vit D-Min (CALTRATE 600+D PLUS PO) Take by mouth daily at 6 (six) AM.   cetirizine (ZYRTEC) 10 MG tablet Take 10 mg by mouth daily.   clonazePAM (KLONOPIN) 1 MG tablet Take 1 mg by mouth at bedtime.   dicyclomine (BENTYL) 10 MG capsule TAKE 1 CAPSULE (10 MG TOTAL) BY MOUTH 4 (FOUR) TIMES DAILY - BEFORE MEALS AND AT BEDTIME.   minoxidil (LONITEN) 2.5 MG tablet Take by mouth daily. 1/2 pill daily   OVER THE COUNTER MEDICATION Vit D once per day  B Complex once per day.   pramipexole (  MIRAPEX) 0.25 MG tablet Take 0.25 mg by mouth. One at night   promethazine (PHENERGAN) 25 MG tablet Take 25 mg by mouth every 6 (six) hours as needed for nausea or vomiting.   topiramate (TOPAMAX) 50 MG tablet Take 50 mg by mouth daily. One at night   Turmeric (QC TUMERIC COMPLEX PO) Take by mouth. Tumeric Curcumin Complex 500mg  daily   Wheat Dextrin (BENEFIBER PO) Take by mouth. Benefiber prebiotic fiber supplement daily   zinc gluconate 50 MG tablet Take 50 mg by mouth daily.   ZOLMitriptan (ZOMIG) 2.5 MG tablet Take 2.5 mg by mouth once. May repeat in 2 hours if headache persists or recurs.     Family History  Problem Relation Age of Onset    Colon cancer Mother    Heart disease Father     Social History   Socioeconomic History   Marital status: Married    Spouse name: Not on file   Number of children: Not on file   Years of education: Not on file   Highest education level: Not on file  Occupational History   Not on file  Tobacco Use   Smoking status: Never    Passive exposure: Never   Smokeless tobacco: Never  Vaping Use   Vaping status: Never Used  Substance and Sexual Activity   Alcohol use: Never   Drug use: Never   Sexual activity: Not on file  Other Topics Concern   Not on file  Social History Narrative   Not on file   Social Drivers of Health   Financial Resource Strain: Low Risk  (11/01/2023)   Received [facility]   Overall Financial Resource Strain (CARDIA)    Difficulty of Paying Living Expenses: Not hard at all  Food Insecurity: No Food Insecurity (11/01/2023)   Received from [facility]   Hunger Vital Sign    Worried About Running Out of Food in the Last Year: Never true    Ran Out of Food in the Last Year: Never true  Transportation Needs: No Transportation Needs (11/01/2023)   Received from [facility] - Transportation    Lack of Transportation (Medical): No    Lack of Transportation (Non-Medical): No  Physical Activity: Insufficiently Active (11/01/2023)   Received from [facility]   Exercise Vital Sign    Days of Exercise per Week: 1 day    Minutes of Exercise per Session: 10 min  Stress: Stress Concern Present (11/01/2023)   Received from [facility] of [facility] - Occupational Stress Questionnaire    Feeling of Stress : To some extent  Social Connections: Moderately Integrated (11/01/2023)   Received from [facility]   Social Network    How would you rate your social network (family, work, friends)?: Adequate participation with social networks       Review of Systems: Gen: Denies fever, chills, anorexia. Denies fatigue,  weakness, weight loss.  CV: Denies chest pain, palpitations, syncope, peripheral edema, and claudication. Resp: Denies dyspnea at rest, cough, wheezing, coughing up blood, and pleurisy. GI: see HPI Derm: Denies rash, itching, dry skin Psych: Denies depression, anxiety, memory loss, confusion. No homicidal or suicidal ideation.  Heme: Denies bruising, bleeding, and enlarged lymph nodes.  Observations/Objective: No distress. Unable to perform physical exam due to video encounter.    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  Symptoms vastly improved on dicyclomine.  We will continue.   Continue Benefiber.  Continue to follow low FODMAP diet.  Counseled on drinking at least 6 glasses of water daily.   Patient can take omeprazole on an as-needed basis. Refilled today.   Colonoscopy recall 2026  Follow-up in 6 months with virtual visit.   I discussed the assessment and treatment plan with the patient. The patient was provided an opportunity to ask questions and all were answered. The patient agreed with the plan and demonstrated an understanding of the instructions.   The patient was advised to call back or seek an in-person evaluation if the symptoms worsen or if the condition fails to improve as anticipated.  I provided >21 minutes of face-to-face time during this MyChart Video encounter.

## 2024-02-17 NOTE — Telephone Encounter (Signed)
Cory Munch, you are scheduled for a virtual visit with your provider today.  Just as we do with appointments in the office, we must obtain your consent to participate.  Your consent will be active for this visit and any virtual visit you may have with one of our providers in the next 365 days.  If you have a MyChart account, I can also send a copy of this consent to you electronically.  All virtual visits are billed to your insurance company just like a traditional visit in the office.  As this is a virtual visit, video technology does not allow for your provider to perform a traditional examination.  This may limit your provider's ability to fully assess your condition.  If your provider identifies any concerns that need to be evaluated in person or the need to arrange testing such as labs, EKG, etc, we will make arrangements to do so.  Although advances in technology are sophisticated, we cannot ensure that it will always work on either your end or our end.  If the connection with a video visit is poor, we may have to switch to a telephone visit.  With either a video or telephone visit, we are not always able to ensure that we have a secure connection.   I need to obtain your verbal consent now.   Are you willing to proceed with your visit today? YES

## 2024-02-17 NOTE — Patient Instructions (Addendum)
I am happy to hear that you are doing well.  Continue on dicyclomine.   Continue on Benefiber.  You can take the omeprazole as needed for breakthrough symptoms of heartburn. I refilled this today.   We will plan on colonoscopy 2026.  Otherwise follow-up in 6 months. We will do this visit virtually.   It was very nice seeing you again today.  Dr. Marletta Lor

## 2024-08-23 ENCOUNTER — Telehealth (INDEPENDENT_AMBULATORY_CARE_PROVIDER_SITE_OTHER): Admitting: Internal Medicine

## 2024-08-23 ENCOUNTER — Encounter: Payer: Self-pay | Admitting: Internal Medicine

## 2024-08-23 ENCOUNTER — Ambulatory Visit: Admitting: Internal Medicine

## 2024-08-23 VITALS — Ht 62.0 in | Wt 105.0 lb

## 2024-08-23 DIAGNOSIS — K219 Gastro-esophageal reflux disease without esophagitis: Secondary | ICD-10-CM | POA: Diagnosis not present

## 2024-08-23 DIAGNOSIS — Z860101 Personal history of adenomatous and serrated colon polyps: Secondary | ICD-10-CM

## 2024-08-23 DIAGNOSIS — K582 Mixed irritable bowel syndrome: Secondary | ICD-10-CM

## 2024-08-23 DIAGNOSIS — Z860100 Personal history of colon polyps, unspecified: Secondary | ICD-10-CM

## 2024-08-23 MED ORDER — DICYCLOMINE HCL 10 MG PO CAPS: 10.0000 mg | ORAL_CAPSULE | Freq: Three times a day (TID) | ORAL | 3 refills | Status: AC

## 2024-08-23 NOTE — Progress Notes (Signed)
 Primary Care Physician:  Lyman Dace, MD    Patient Location: Home   Provider Location: [facility] office   Reason for Visit: Follow up visit   Total time (minutes) spent on medical discussion: >21 minutes     Virtual Visit via MyChart Video Note  I connected with Miranda Jones on 08/23/24 at  9:50 AM EDT by video and verified that I am speaking with the correct person using two identifiers.   I discussed the limitations, risks, security and privacy concerns of performing an evaluation and management service by video and the availability of in person appointments. I also discussed with the patient that there may be a patient responsible charge related to this service. The patient expressed understanding and agreed to proceed.  Chief Complaint  Patient presents with   Follow-up    Follow up on GERD and IBC-C and D     History of Present Illness: Miranda Jones is a 73 y.o. female who presents to clinic today for follow-up visit.  History of irritable bowel syndrome.  Previously followed in [place], see further work-up below.   History of peptic ulcer disease in the setting of chronic NSAID use with meloxicam.  No longer taking NSAIDs. Currently taking Omeprazole  40 mg as needed.   Regards to her bowels, on initial visit she noted to me that she was having constant diarrhea 4-5 times daily always loose, watery.  She states she is scared to leave the house or to eat as she will immediately have to have diarrhea.  Notes a few episodes of incontinence, now wearing briefs.  States her life is very much affected by this and this is her last chance.  Notes feeling depressed about her condition.   Celiac panel negative.  Was started on Bentyl  June 2023 and symptoms vastly improved.  Also instructed her to follow a low FODMAP diet as well as start on Benefiber.    Today, will have a normal bowel movement every 1 to 2 days.  No melena hematochezia.  No further diarrhea  or incontinence.    Procedures: EGD 08/2021- three 1-2 cm DUs. Hpylori bx negative. CT with gastric wall thickening  Colonoscopy 05/2020-[facility]-stecoral ulcer with 3 small adenomas and multiple benign polyps Colonoscopy 05/2014-[place]- multiple adenomas with a 3-5-year recall.  Past Medical History:  Diagnosis Date   Asthma    Chronic constipation    COPD (chronic obstructive pulmonary disease) (HCC)    Kidney stones      Past Surgical History:  Procedure Laterality Date   CESAREAN SECTION     COLONOSCOPY     hysterectomy       No outpatient medications have been marked as taking for the 08/23/24 encounter (Video Visit) with Cindie Carlin POUR, DO.     Family History  Problem Relation Age of Onset   Colon cancer Mother    Heart disease Father     Social History   Socioeconomic History   Marital status: Married    Spouse name: Not on file   Number of children: Not on file   Years of education: Not on file   Highest education level: Not on file  Occupational History   Not on file  Tobacco Use   Smoking status: Never    Passive exposure: Never   Smokeless tobacco: Never  Vaping Use   Vaping status: Never Used  Substance and Sexual Activity   Alcohol use: Never   Drug use: Never   Sexual activity: Not  on file  Other Topics Concern   Not on file  Social History Narrative   Not on file   Social Drivers of Health   Financial Resource Strain: Low Risk  (03/13/2024)   Received from [facility]   Overall Financial Resource Strain (CARDIA)    Difficulty of Paying Living Expenses: Not hard at all  Food Insecurity: No Food Insecurity (03/13/2024)   Received [facility]   Hunger Vital Sign    Within the past 12 months, you worried that your food would run out before you got the money to buy more.: Never true    Within the past 12 months, the food you bought just didn't last and you didn't have money to get more.: Never true  Transportation Needs: No  Transportation Needs (03/13/2024)   Received from [facility] - Transportation    Lack of Transportation (Medical): No    Lack of Transportation (Non-Medical): No  Physical Activity: Insufficiently Active (03/13/2024)   Received from [facility]   Exercise Vital Sign    On average, how many days per week do you engage in moderate to strenuous exercise (like a brisk walk)?: 1 day    On average, how many minutes do you engage in exercise at this level?: 10 min  Stress: No Stress Concern Present (03/13/2024)   Received from [facility] - Occupational Stress Questionnaire    Feeling of Stress : Only a little  Social Connections: Somewhat Isolated (03/13/2024)   Received from [facility]   Social Network    How would you rate your social network (family, work, friends)?: Restricted participation with some degree of social isolation       Review of Systems: Gen: Denies fever, chills, anorexia. Denies fatigue, weakness, weight loss.  CV: Denies chest pain, palpitations, syncope, peripheral edema, and claudication. Resp: Denies dyspnea at rest, cough, wheezing, coughing up blood, and pleurisy. GI: see HPI Derm: Denies rash, itching, dry skin Psych: Denies depression, anxiety, memory loss, confusion. No homicidal or suicidal ideation.  Heme: Denies bruising, bleeding, and enlarged lymph nodes.  Observations/Objective: No distress. Unable to perform physical exam due to video encounter.    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     Symptoms vastly improved on dicyclomine .  We will continue.   Continue Benefiber.  Continue to follow low FODMAP diet.  Counseled on drinking at least 6 glasses of water daily.   Patient can take omeprazole  on an as-needed basis.  Refilled today.   Patient due for colonoscopy for surveillance purposes. Will schedule today. Patient wants to wait until October.   The risks including infection, bleed, or perforation as well as benefits, limitations, alternatives and imponderables have been reviewed with the patient. Questions have been answered. All parties agreeable.  Follow-up in 6 months with virtual visit.   I discussed the assessment and treatment plan with the patient. The patient was provided an opportunity to ask questions and all were answered. The patient agreed with the plan and demonstrated an understanding of the instructions.   The patient was advised to call back or seek an in-person evaluation if the symptoms worsen or if the condition fails to improve as anticipated.  I provided >21 minutes of face-to-face time during this MyChart Video encounter.

## 2024-08-23 NOTE — Patient Instructions (Signed)
   I am happy to hear that you are doing well.  Continue on dicyclomine .   Continue on Benefiber.  You can take the omeprazole  as needed for breakthrough symptoms of heartburn. I refilled this today.   We will plan on colonoscopy in October.  Otherwise follow-up in 6 months. We will do this visit virtually.   It was very nice seeing you again today.  Dr. Cindie

## 2024-08-23 NOTE — Addendum Note (Signed)
 Addended by: Irma Roulhac I on: 08/23/2024 10:14 AM   Modules accepted: Orders

## 2024-09-05 ENCOUNTER — Other Ambulatory Visit: Payer: Self-pay | Admitting: *Deleted

## 2024-09-05 ENCOUNTER — Encounter: Payer: Self-pay | Admitting: *Deleted

## 2024-09-05 MED ORDER — NA SULFATE-K SULFATE-MG SULF 17.5-3.13-1.6 GM/177ML PO SOLN: ORAL | 0 refills | Status: AC

## 2024-10-23 ENCOUNTER — Ambulatory Visit (HOSPITAL_COMMUNITY)
Admission: RE | Admit: 2024-10-23 | Discharge: 2024-10-23 | Disposition: A | Discharge status: Home / Self Care | Attending: Internal Medicine | Admitting: Internal Medicine

## 2024-10-23 ENCOUNTER — Ambulatory Visit (HOSPITAL_COMMUNITY): Admitting: Certified Registered"

## 2024-10-23 ENCOUNTER — Encounter (HOSPITAL_COMMUNITY): Payer: Self-pay | Admitting: Internal Medicine

## 2024-10-23 ENCOUNTER — Encounter (HOSPITAL_COMMUNITY)
Admission: RE | Disposition: A | Discharge status: Home / Self Care | Payer: Self-pay | Source: Home / Self Care | Attending: Internal Medicine

## 2024-10-23 ENCOUNTER — Other Ambulatory Visit: Payer: Self-pay

## 2024-10-23 DIAGNOSIS — K219 Gastro-esophageal reflux disease without esophagitis: Secondary | ICD-10-CM | POA: Insufficient documentation

## 2024-10-23 DIAGNOSIS — Z09 Encounter for follow-up examination after completed treatment for conditions other than malignant neoplasm: Secondary | ICD-10-CM | POA: Diagnosis present

## 2024-10-23 DIAGNOSIS — Z1211 Encounter for screening for malignant neoplasm of colon: Secondary | ICD-10-CM

## 2024-10-23 DIAGNOSIS — J4489 Other specified chronic obstructive pulmonary disease: Secondary | ICD-10-CM | POA: Insufficient documentation

## 2024-10-23 DIAGNOSIS — K648 Other hemorrhoids: Secondary | ICD-10-CM | POA: Diagnosis not present

## 2024-10-23 DIAGNOSIS — D120 Benign neoplasm of cecum: Secondary | ICD-10-CM | POA: Diagnosis not present

## 2024-10-23 DIAGNOSIS — D122 Benign neoplasm of ascending colon: Secondary | ICD-10-CM | POA: Insufficient documentation

## 2024-10-23 DIAGNOSIS — K5730 Diverticulosis of large intestine without perforation or abscess without bleeding: Secondary | ICD-10-CM | POA: Diagnosis not present

## 2024-10-23 DIAGNOSIS — K562 Volvulus: Secondary | ICD-10-CM

## 2024-10-23 DIAGNOSIS — Z860101 Personal history of adenomatous and serrated colon polyps: Secondary | ICD-10-CM

## 2024-10-23 DIAGNOSIS — D123 Benign neoplasm of transverse colon: Secondary | ICD-10-CM | POA: Insufficient documentation

## 2024-10-23 HISTORY — PX: COLONOSCOPY: SHX5424

## 2024-10-23 SURGERY — COLONOSCOPY
Anesthesia: General

## 2024-10-23 MED ORDER — PROPOFOL 10 MG/ML IV BOLUS
INTRAVENOUS | Status: DC | PRN
  Administered 2024-10-23: 100 mg via INTRAVENOUS
  Administered 2024-10-23: 150 ug/kg/min via INTRAVENOUS

## 2024-10-23 MED ORDER — LIDOCAINE HCL (CARDIAC) PF 100 MG/5ML IV SOSY
PREFILLED_SYRINGE | INTRAVENOUS | Status: DC | PRN
  Administered 2024-10-23: 50 mg via INTRAVENOUS

## 2024-10-23 MED ORDER — STERILE WATER FOR IRRIGATION IR SOLN
Status: DC | PRN
  Administered 2024-10-23: 50 mL

## 2024-10-23 MED ORDER — LACTATED RINGERS IV SOLN: INTRAVENOUS | Status: DC

## 2024-10-23 NOTE — Anesthesia Postprocedure Evaluation (Signed)
 Anesthesia Post Note  Patient: Miranda Jones  Procedure(s) Performed: COLONOSCOPY  Patient location during evaluation: PACU Anesthesia Type: General Level of consciousness: awake and alert Pain management: pain level controlled Vital Signs Assessment: post-procedure vital signs reviewed and stable Respiratory status: spontaneous breathing, nonlabored ventilation, respiratory function stable and patient connected to nasal cannula oxygen Cardiovascular status: blood pressure returned to baseline and stable Postop Assessment: no apparent nausea or vomiting Anesthetic complications: no   No notable events documented.   Last Vitals:  Vitals:   10/23/24 1133 10/23/24 1137  BP: (!) 112/57 (!) 114/54  Pulse: 82 87  Resp: 19 19  Temp: 36.5 C   SpO2: 98% 100%    Last Pain:  Vitals:   10/23/24 1137  TempSrc:   PainSc: 0-No pain                 Andrea Limes

## 2024-10-23 NOTE — Transfer of Care (Signed)
 Immediate Anesthesia Transfer of Care Note  Patient: Miranda Jones  Procedure(s) Performed: COLONOSCOPY  Patient Location: Endoscopy Unit  Anesthesia Type:General  Level of Consciousness: drowsy, patient cooperative, and responds to stimulation  Airway & Oxygen Therapy: Patient Spontanous Breathing  Post-op Assessment: Report given to RN and Post -op Vital signs reviewed and stable  Post vital signs: Reviewed and stable  Last Vitals:  Vitals Value Taken Time  BP 112/57 10/23/24 11:33  Temp 36.5 C 10/23/24 11:33  Pulse 82 10/23/24 11:33  Resp 19 10/23/24 11:33  SpO2 98 % 10/23/24 11:33    Last Pain:  Vitals:   10/23/24 1133  TempSrc: Oral  PainSc:       Patients Stated Pain Goal: 7 (10/23/24 1011)  Complications: No notable events documented.

## 2024-10-23 NOTE — Anesthesia Preprocedure Evaluation (Signed)
 Anesthesia Evaluation  Patient identified by MRN, date of birth, ID band Patient awake    Reviewed: Allergy & Precautions, H&P , NPO status , Patient's Chart, lab work & pertinent test results  Airway Mallampati: II  TM Distance: >3 FB Neck ROM: Full    Dental no notable dental hx.    Pulmonary asthma , COPD   Pulmonary exam normal breath sounds clear to auscultation       Cardiovascular negative cardio ROS Normal cardiovascular exam Rhythm:Regular Rate:Normal     Neuro/Psych negative neurological ROS  negative psych ROS   GI/Hepatic Neg liver ROS,GERD  ,,  Endo/Other  negative endocrine ROS    Renal/GU Renal disease  negative genitourinary   Musculoskeletal negative musculoskeletal ROS (+)    Abdominal   Peds negative pediatric ROS (+)  Hematology negative hematology ROS (+)   Anesthesia Other Findings   Reproductive/Obstetrics negative OB ROS                              Anesthesia Physical Anesthesia Plan  ASA: 2  Anesthesia Plan: General   Post-op Pain Management:    Induction: Intravenous  PONV Risk Score and Plan:   Airway Management Planned: Nasal Cannula  Additional Equipment:   Intra-op Plan:   Post-operative Plan:   Informed Consent: I have reviewed the patients History and Physical, chart, labs and discussed the procedure including the risks, benefits and alternatives for the proposed anesthesia with the patient or authorized representative who has indicated his/her understanding and acceptance.     Dental advisory given  Plan Discussed with: CRNA  Anesthesia Plan Comments:         Anesthesia Quick Evaluation

## 2024-10-23 NOTE — H&P (Signed)
 Primary Care Physician:  Apolinar December, MD Primary Gastroenterologist:  Dr. Cindie  Pre-Procedure History & Physical: HPI:  Miranda Jones is a 73 y.o. female is here for a colonoscopy to be performed for surveillance purposes, personal history of adenomatous colon polyps in 2021  Past Medical History:  Diagnosis Date   Asthma    Chronic constipation    COPD (chronic obstructive pulmonary disease) (HCC)    Kidney stones     Past Surgical History:  Procedure Laterality Date   CESAREAN SECTION     COLONOSCOPY     hysterectomy      Prior to Admission medications   Medication Sig Start Date End Date Taking? Authorizing Provider  Ascorbic Acid (VITAMIN C) 500 MG CAPS Take by mouth. One daily   Yes [provider]  BIOTIN PO Take by mouth. 1,000 mg daily   Yes [provider]  Calcium Carbonate-Vit D-Min (CALTRATE 600+D PLUS PO) Take by mouth daily at 6 (six) AM.   Yes [provider]  cetirizine (ZYRTEC) 10 MG tablet Take 10 mg by mouth daily.   Yes [provider]  clonazePAM (KLONOPIN) 1 MG tablet Take 1 mg by mouth at bedtime.   Yes [provider]  dicyclomine  (BENTYL ) 10 MG capsule Take 1 capsule (10 mg total) by mouth 4 (four) times daily -  before meals and at bedtime. 08/23/24 08/23/25 Yes Nymir Ringler K, DO  HYDROcodone-acetaminophen (NORCO) 10-325 MG tablet Take 1 tablet by mouth every 6 (six) hours as needed.   Yes [provider]  minoxidil (LONITEN) 2.5 MG tablet Take by mouth daily. 1/2 pill daily   Yes [provider]  Na Sulfate-K Sulfate-Mg Sulfate concentrate (SUPREP) 17.5-3.13-1.6 GM/177ML SOLN As directed 09/05/24  Yes Santasia Rew K, DO  omeprazole  (PRILOSEC) 20 MG capsule Take 1 capsule (20 mg total) by mouth daily. 02/17/24 02/16/25 Yes Wynnie Pacetti K, DO  OVER THE COUNTER MEDICATION Vit D once per day  B Complex once per day.   Yes [provider]  pramipexole (MIRAPEX) 0.25  MG tablet Take 0.25 mg by mouth. One at night   Yes [provider]  promethazine (PHENERGAN) 25 MG tablet Take 25 mg by mouth every 6 (six) hours as needed for nausea or vomiting.   Yes [provider]  topiramate (TOPAMAX) 50 MG tablet Take 50 mg by mouth daily. One at night   Yes [provider]  Turmeric (QC TUMERIC COMPLEX PO) Take by mouth. Tumeric Curcumin Complex 500mg  daily   Yes [provider]  Wheat Dextrin (BENEFIBER PO) Take by mouth. Benefiber prebiotic fiber supplement daily   Yes [provider]  zinc gluconate 50 MG tablet Take 50 mg by mouth daily.   Yes [provider]  ZOLMitriptan (ZOMIG) 2.5 MG tablet Take 2.5 mg by mouth once. May repeat in 2 hours if headache persists or recurs.   Yes [provider]  OnabotulinumtoxinA (BOTOX IJ) Inject as directed. Q three months due to Migraine Patient not taking: Reported on 02/17/2024    [provider]    Allergies as of 09/05/2024 - Review Complete 08/23/2024  Allergen Reaction Noted   Dilaudid [hydromorphone]  06/24/2022   Macrobid [nitrofurantoin]  06/24/2022    Family History  Problem Relation Age of Onset   Colon cancer Mother    Heart disease Father     Social History   Socioeconomic History   Marital status: Married    Spouse name: Not on file  Number of children: Not on file   Years of education: Not on file   Highest education level: Not on file  Occupational History   Not on file  Tobacco Use   Smoking status: Never    Passive exposure: Never   Smokeless tobacco: Never  Vaping Use   Vaping status: Never Used  Substance and Sexual Activity   Alcohol use: Never   Drug use: Never   Sexual activity: Not on file  Other Topics Concern   Not on file  Social History Narrative   Not on file   Social Drivers of Health   Financial Resource Strain: Low Risk  (08/26/2024)   Received from [facility]   Overall Financial Resource  Strain (CARDIA)    How hard is it for you to pay for the very basics like food, housing, medical care, and heating?: Not hard at all  Food Insecurity: No Food Insecurity (08/26/2024)   Received from [facility]   Hunger Vital Sign    Within the past 12 months, you worried that your food would run out before you got the money to buy more.: Never true    Within the past 12 months, the food you bought just didn't last and you didn't have money to get more.: Never true  Transportation Needs: No Transportation Needs (08/26/2024)   Received from [facility] - Transportation    In the past 12 months, has lack of transportation kept you from medical appointments or from getting medications?: No    In the past 12 months, has lack of transportation kept you from meetings, work, or from getting things needed for daily living?: No  Physical Activity: Insufficiently Active (08/26/2024)   Received from [facility]   Exercise Vital Sign    On average, how many days per week do you engage in moderate to strenuous exercise (like a brisk walk)?: 3 days    On average, how many minutes do you engage in exercise at this level?: 30 min  Stress: Stress Concern Present (08/26/2024)   Received from [facility] - Occupational Stress Questionnaire    Do you feel stress - tense, restless, nervous, or anxious, or unable to sleep at night because your mind is troubled all the time - these days?: To some extent  Social Connections: Moderately Integrated (08/26/2024)   Received from [facility]   Social Network    How would you rate your social network (family, work, friends)?: Adequate participation with social networks  Intimate Partner Violence: Not At Risk (08/26/2024)   Received from [facility]   HITS    Over the last 12 months how often did your partner physically hurt you?: Never    Over the last 12 months how often did your partner insult you or talk  down to you?: Never    Over the last 12 months how often did your partner threaten you with physical harm?: Never    Over the last 12 months how often did your partner scream or curse at you?: Never    Review of Systems: See HPI, otherwise negative ROS  Physical Exam: Vital signs in last 24 hours: Temp:  [97.9 F (36.6 C)] 97.9 F (36.6 C) (10/28 1011) Pulse Rate:  [100] 100 (10/28 1011) Resp:  [19] 19 (10/28 1011) BP: (170)/(78) 170/78 (10/28 1011) SpO2:  [98 %] 98 % (10/28 1011) Weight:  [46.3 kg] 46.3 kg (10/28 1011)   General:  Alert,  Well-developed, well-nourished, pleasant and cooperative in NAD Head:  Normocephalic and atraumatic. Eyes:  Sclera clear, no icterus.   Conjunctiva pink. Ears:  Normal auditory acuity. Nose:  No deformity, discharge,  or lesions. Msk:  Symmetrical without gross deformities. Normal posture. Extremities:  Without clubbing or edema. Neurologic:  Alert and  oriented x4;  grossly normal neurologically. Skin:  Intact without significant lesions or rashes. Psych:  Alert and cooperative. Normal mood and affect.  Impression/Plan: Miranda Jones is here for a colonoscopy to be performed for surveillance purposes, personal history of adenomatous colon polyps in 2021  The risks of the procedure including infection, bleed, or perforation as well as benefits, limitations, alternatives and imponderables have been reviewed with the patient. Questions have been answered. All parties agreeable.

## 2024-10-23 NOTE — Discharge Instructions (Addendum)
  Colonoscopy Discharge Instructions  Read the instructions outlined below and refer to this sheet in the next few weeks. These discharge instructions provide you with general information on caring for yourself after you leave the hospital. Your doctor may also give you specific instructions. While your treatment has been planned according to the most current medical practices available, unavoidable complications occasionally occur.   ACTIVITY You may resume your regular activity, but move at a slower pace for the next 24 hours.  Take frequent rest periods for the next 24 hours.  Walking will help get rid of the air and reduce the bloated feeling in your belly (abdomen).  No driving for 24 hours (because of the medicine (anesthesia) used during the test).   Do not sign any important legal documents or operate any machinery for 24 hours (because of the anesthesia used during the test).  NUTRITION Drink plenty of fluids.  You may resume your normal diet as instructed by your doctor.  Begin with a light meal and progress to your normal diet. Heavy or fried foods are harder to digest and may make you feel sick to your stomach (nauseated).  Avoid alcoholic beverages for 24 hours or as instructed.  MEDICATIONS You may resume your normal medications unless your doctor tells you otherwise.  WHAT YOU CAN EXPECT TODAY Some feelings of bloating in the abdomen.  Passage of more gas than usual.  Spotting of blood in your stool or on the toilet paper.  IF YOU HAD POLYPS REMOVED DURING THE COLONOSCOPY: No aspirin products for 7 days or as instructed.  No alcohol for 7 days or as instructed.  Eat a soft diet for the next 24 hours.  FINDING OUT THE RESULTS OF YOUR TEST Not all test results are available during your visit. If your test results are not back during the visit, make an appointment with your caregiver to find out the results. Do not assume everything is normal if you have not heard from your  caregiver or the medical facility. It is important for you to follow up on all of your test results.  SEEK IMMEDIATE MEDICAL ATTENTION IF: You have more than a spotting of blood in your stool.  Your belly is swollen (abdominal distention).  You are nauseated or vomiting.  You have a temperature over 101.  You have abdominal pain or discomfort that is severe or gets worse throughout the day.   Your colonoscopy revealed 14 polyp(s) which I removed successfully. Await pathology results, my office will contact you. I recommend repeating colonoscopy in 1 year for surveillance purposes, depending on pathology results.  You also have diverticulosis and internal hemorrhoids. I would recommend increasing fiber in your diet or adding OTC Benefiber/Metamucil. Be sure to drink at least 4 to 6 glasses of water daily. Follow-up with me in February for virtual visit.    I hope you have a great rest of your week!  Miranda Jones. Miranda Jones, D.O. Gastroenterology and Hepatology [facility]

## 2024-10-23 NOTE — Op Note (Signed)
 [facility] Patient Name: Miranda Jones Procedure Date: 10/23/2024 10:48 AM MRN: [id number] Date of Birth: [birth year] Attending MD: Carlin POUR. Cindie , [place], [id number] CSN: [id number] Age: 73 Admit Type: Outpatient Procedure:                Colonoscopy Indications:              High risk colon cancer surveillance: Personal                            history of colonic polyps Providers:                Carlin POUR. Cindie, DO, Jon LABOR. Gerome RN, RN,                            Bascom Blush Referring MD:              Medicines:                See the Anesthesia note for documentation of the                            administered medications Complications:            No immediate complications. Estimated Blood Loss:     Estimated blood loss was minimal. Procedure:                Pre-Anesthesia Assessment:                           - The anesthesia plan was to use monitored                            anesthesia care (MAC).                           After obtaining informed consent, the colonoscope                            was passed under direct vision. Throughout the                            procedure, the patient's blood pressure, pulse, and                            oxygen saturations were monitored continuously. The                            PCF-HQ190L ([id number]) Peds Colon was introduced                            through the anus and advanced to the the cecum,                            identified by appendiceal orifice and ileocecal                            valve. The colonoscopy was somewhat difficult  due                            to a redundant colon and significant looping.                            Successful completion of the procedure was aided by                            applying abdominal pressure. The patient tolerated                            the procedure well. The quality of the bowel                            preparation was evaluated using the  BBPS [facility]                            Bowel Preparation Scale) with scores of: Right                            Colon = 2 (minor amount of residual staining, small                            fragments of stool and/or opaque liquid, but mucosa                            seen well), Transverse Colon = 2 (minor amount of                            residual staining, small fragments of stool and/or                            opaque liquid, but mucosa seen well) and Left Colon                            = 2 (minor amount of residual staining, small                            fragments of stool and/or opaque liquid, but mucosa                            seen well). The total BBPS score equals 6. The                            quality of the bowel preparation was good. Scope In: 11:00:15 AM Scope Out: 11:30:07 AM Scope Withdrawal Time: 0 hours 19 minutes 44 seconds  Total Procedure Duration: 0 hours 29 minutes 52 seconds  Findings:      Non-bleeding internal hemorrhoids were found.      Scattered medium-mouthed and small-mouthed diverticula were found in the       sigmoid colon and descending colon.      Five sessile polyps were found in the  ascending colon and cecum. The       polyps were 4 to 7 mm in size. These polyps were removed with a cold       snare. Resection and retrieval were complete.      Nine sessile polyps were found in the transverse colon. The polyps were       3 to 7 mm in size. These polyps were removed with a cold snare.       Resection and retrieval were complete. Impression:               - Non-bleeding internal hemorrhoids.                           - Diverticulosis in the sigmoid colon and in the                            descending colon.                           - Five 4 to 7 mm polyps in the ascending colon and                            in the cecum, removed with a cold snare. Resected                            and retrieved.                           - Nine 3  to 7 mm polyps in the transverse colon,                            removed with a cold snare. Resected and retrieved. Moderate Sedation:      Per Anesthesia Care Recommendation:           - Patient has a contact number available for                            emergencies. The signs and symptoms of potential                            delayed complications were discussed with the                            patient. Return to normal activities tomorrow.                            Written discharge instructions were provided to the                            patient.                           - Resume previous diet.                           - Continue present medications.                           -  Await pathology results.                           - Repeat colonoscopy in 1 year for surveillance.                           - Return to GI clinic as previously scheduled. Procedure Code(s):        --- Professional ---                           [phone], Colonoscopy, flexible; with removal of                            tumor(s), polyp(s), or other lesion(s) by snare                            technique Diagnosis Code(s):        --- Professional ---                           Z86.010, Personal history of colonic polyps                           K64.8, Other hemorrhoids                           D12.2, Benign neoplasm of ascending colon                           D12.0, Benign neoplasm of cecum                           D12.3, Benign neoplasm of transverse colon (hepatic                            flexure or splenic flexure)                           K57.30, Diverticulosis of large intestine without                            perforation or abscess without bleeding CPT copyright 2022 [employer]. All rights reserved. The codes documented in this report are preliminary and upon coder review may  be revised to meet current compliance requirements. Carlin POUR. Cindie, DO Carlin POUR.  Cindie, DO 10/23/2024 11:35:27 AM This report has been signed electronically. Number of Addenda: 0

## 2024-10-24 ENCOUNTER — Encounter (HOSPITAL_COMMUNITY): Payer: Self-pay | Admitting: Internal Medicine

## 2024-10-24 LAB — SURGICAL PATHOLOGY

## 2024-10-30 ENCOUNTER — Ambulatory Visit: Payer: Self-pay | Admitting: Internal Medicine

## 2024-10-31 NOTE — Progress Notes (Signed)
 done
